# Patient Record
Sex: Male | Born: 1995
Health system: Southern US, Community
[De-identification: ages and names within clinical notes are randomized; demographics above are authoritative.]

## PROBLEM LIST (undated history)

## (undated) HISTORY — PX: LIVER SURGERY: SHX698

## (undated) HISTORY — PX: KIDNEY SURGERY: SHX687

---
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
## 2017-09-09 ENCOUNTER — Emergency Department (HOSPITAL_COMMUNITY)
Admission: EM | Admit: 2017-09-09 | Discharge: 2017-09-10 | Disposition: A | Discharge status: Home / Self Care | Payer: Worker's Compensation | Attending: Emergency Medicine | Admitting: Emergency Medicine

## 2017-09-09 ENCOUNTER — Encounter (HOSPITAL_COMMUNITY): Payer: Self-pay | Admitting: Emergency Medicine

## 2017-09-09 DIAGNOSIS — X58XXXA Exposure to other specified factors, initial encounter: Secondary | ICD-10-CM | POA: Diagnosis not present

## 2017-09-09 DIAGNOSIS — T1592XA Foreign body on external eye, part unspecified, left eye, initial encounter: Secondary | ICD-10-CM

## 2017-09-09 DIAGNOSIS — Y9389 Activity, other specified: Secondary | ICD-10-CM | POA: Insufficient documentation

## 2017-09-09 DIAGNOSIS — Z87891 Personal history of nicotine dependence: Secondary | ICD-10-CM | POA: Diagnosis not present

## 2017-09-09 DIAGNOSIS — Y990 Civilian activity done for income or pay: Secondary | ICD-10-CM | POA: Insufficient documentation

## 2017-09-09 DIAGNOSIS — Y929 Unspecified place or not applicable: Secondary | ICD-10-CM | POA: Insufficient documentation

## 2017-09-09 DIAGNOSIS — S0502XA Injury of conjunctiva and corneal abrasion without foreign body, left eye, initial encounter: Secondary | ICD-10-CM | POA: Insufficient documentation

## 2017-09-09 NOTE — ED Triage Notes (Signed)
Patient reported a small piece of tile chip hit his left eye while at work this evening , no bleeding or vision loss .

## 2017-09-10 MED ORDER — TETANUS-DIPHTH-ACELL PERTUSSIS 5-2.5-18.5 LF-MCG/0.5 IM SUSP: 0.5000 mL | Freq: Once | INTRAMUSCULAR | Status: DC

## 2017-09-10 MED ORDER — FLUORESCEIN SODIUM 1 MG OP STRP
1.0000 | ORAL_STRIP | Freq: Once | OPHTHALMIC | Status: AC
  Administered 2017-09-10: 1 via OPHTHALMIC
  Filled 2017-09-10: qty 1

## 2017-09-10 MED ORDER — KETOROLAC TROMETHAMINE 0.5 % OP SOLN
1.0000 [drp] | Freq: Four times a day (QID) | OPHTHALMIC | Status: DC
  Administered 2017-09-10: 1 [drp] via OPHTHALMIC
  Filled 2017-09-10: qty 3

## 2017-09-10 MED ORDER — TETRACAINE HCL 0.5 % OP SOLN
1.0000 [drp] | Freq: Once | OPHTHALMIC | Status: AC
  Administered 2017-09-10: 1 [drp] via OPHTHALMIC
  Filled 2017-09-10: qty 4

## 2017-09-10 MED ORDER — POLYMYXIN B-TRIMETHOPRIM 10000-0.1 UNIT/ML-% OP SOLN
1.0000 [drp] | OPHTHALMIC | Status: DC
  Administered 2017-09-10: 1 [drp] via OPHTHALMIC
  Filled 2017-09-10: qty 10

## 2017-09-10 NOTE — ED Provider Notes (Signed)
[facility]   CSN: [id number] Arrival date & time: 09/09/17  2145     History   Chief Complaint Chief Complaint  Patient presents with  . Eye Injury    HPI Bradley Tyler is a 22 y.o. male.  The history is provided by the patient and medical records.  Eye Injury      22 y.o. M here with left eye FB.  States he was tiling at work and a piece flew off and is lodged in his eye.  States he can tell that something is his eye but denies blurred vision.  Does not wear glasses or contact lenses.  No baseline vision issues.  Date of last tetanus unknown.  History reviewed. No pertinent past medical history.  There are no active problems to display for this patient.   Past Surgical History:  Procedure Laterality Date  . KIDNEY SURGERY    . LIVER SURGERY         Home Medications    Prior to Admission medications   Medication Sig Start Date End Date Taking? Authorizing Provider  cyclobenzaprine (FLEXERIL) 10 MG tablet Take 1 tablet (10 mg total) by mouth 2 (two) times daily as needed for muscle spasms. 06/10/14   Elpidio AnisUpstill, Shari, PA-C  HYDROcodone-acetaminophen (NORCO/VICODIN) 5-325 MG per tablet Take 1-2 tablets by mouth every 4 (four) hours as needed. 06/10/14   Elpidio AnisUpstill, Shari, PA-C  ibuprofen (ADVIL,MOTRIN) 800 MG tablet Take 1 tablet (800 mg total) by mouth 3 (three) times daily. 06/10/14   Elpidio AnisUpstill, Shari, PA-C  naproxen (NAPROSYN) 500 MG tablet Take 1 tablet (500 mg total) by mouth 2 (two) times daily with a meal. 11/21/13   Eber HongMiller, Brian, MD  ondansetron (ZOFRAN ODT) 4 MG disintegrating tablet Take 1 tablet (4 mg total) by mouth every 8 (eight) hours as needed for nausea. 11/21/13   Eber HongMiller, Brian, MD    Family History No family history on file.  Social History Social History   Tobacco Use  . Smoking status: Former Games developermoker  . Smokeless tobacco: Never Used  Substance Use Topics  . Alcohol use: No  . Drug use: No      Allergies   Patient has no known allergies.   Review of Systems Review of Systems  Eyes: Positive for pain.  All other systems reviewed and are negative.    Physical Exam Updated Vital Signs BP 139/88 (BP Location: Right Arm)   Pulse 72   Temp 98.6 F (37 C) (Oral)   Resp 18   SpO2 100%   Physical Exam  Constitutional: He is oriented to person, place, and time. He appears well-developed and well-nourished.  HENT:  Head: Normocephalic and atraumatic.  Mouth/Throat: Oropharynx is clear and moist.  Eyes: Conjunctivae and EOM are normal. Pupils are equal, round, and reactive to light.  FB noted to left central eye; conjunctiva appears injected; EOMs full intact; no lid edema or erythema  Neck: Normal range of motion.  Cardiovascular: Normal rate, regular rhythm and normal heart sounds.  Pulmonary/Chest: Effort normal and breath sounds normal. No stridor. No respiratory distress.  Musculoskeletal: Normal range of motion.  Neurological: He is alert and oriented to person, place, and time.  Skin: Skin is warm and dry.  Psychiatric: He has a normal mood and affect.  Nursing note and vitals reviewed.    ED Treatments / Results  Labs (all labs ordered are listed, but only abnormal results are displayed) Labs Reviewed - No data  to display  EKG  EKG Interpretation None       Radiology No results found.  Procedures .Foreign Body Removal Date/Time: 09/10/2017 1:27 AM Performed by: Garlon Hatchet, PA-C Authorized by: Garlon Hatchet, PA-C  Consent: Verbal consent obtained. Risks and benefits: risks, benefits and alternatives were discussed Consent given by: patient Required items: required blood products, implants, devices, and special equipment available Patient identity confirmed: verbally with patient Body area: eye Location details: left cornea  Anesthesia: Local Anesthetic: topical anesthetic  Sedation: Patient sedated: no  Patient restrained:  no Localization method: visualized Removal mechanism: 27-gauge needle, irrigation and moist cotton swab Eye examined with fluorescein. Corneal abrasion size: small Corneal abrasion location: central No residual rust ring present. Dressing: antibiotic drops Depth: embedded Complexity: simple 1 objects recovered. Objects recovered: small piece of tile Post-procedure assessment: foreign body removed Patient tolerance: Patient tolerated the procedure well with no immediate complications   (including critical care time)  Medications Ordered in ED Medications  ketorolac (ACULAR) 0.5 % ophthalmic solution 1 drop (1 drop Left Eye Given 09/10/17 0211)  trimethoprim-polymyxin b (POLYTRIM) ophthalmic solution 1 drop (1 drop Left Eye Given 09/10/17 0211)  Tdap (BOOSTRIX) injection 0.5 mL (0.5 mLs Intramuscular Refused 09/10/17 0211)  fluorescein ophthalmic strip 1 strip (1 strip Left Eye Given 09/10/17 0119)  tetracaine (PONTOCAINE) 0.5 % ophthalmic solution 1 drop (1 drop Left Eye Given 09/10/17 0119)     Initial Impression / Assessment and Plan / ED Course  I have reviewed the triage vital signs and the nursing notes.  Pertinent labs & imaging results that were available during my care of the patient were reviewed by me and considered in my medical decision making (see chart for details).  22 year old male here with piece of tile embedded in left eye.  States it is visible in the central portion of his vision.  This is visible on exam.  Topical tetracaine drops applied and the child was able to be removed with combination of sterile cotton swab, irrigation, and 23-gauge needle.  I was irrigated afterwards.  Fluorescein stain performed, does have evidence of corneal abrasion but no ulcer or uptake.  There is no residual rust ring.  Tetanus will be updated.  He will be started on Acular and Polytrim drops.  He will need close follow-up with ophthalmology-- referral given.  Discussed plan with patient,  he acknowledged understanding and agreed with plan of care.  Return precautions given for new or worsening symptoms.  Final Clinical Impressions(s) / ED Diagnoses   Final diagnoses:  Foreign body of left eye, initial encounter  Abrasion of left cornea, initial encounter    ED Discharge Orders    None       Garlon Hatchet, PA-C 09/10/17 0215    Shon Baton, MD 09/10/17 [phone]

## 2017-09-10 NOTE — Discharge Instructions (Signed)
Take the prescribed medication as directed. Follow-up with Dr. Allena KatzPatel (eye doctor) if you have any ongoing issues or any new/acute changes. Return here for new concerns.

## 2019-07-07 ENCOUNTER — Other Ambulatory Visit: Payer: Self-pay

## 2019-07-07 DIAGNOSIS — Z20822 Contact with and (suspected) exposure to covid-19: Secondary | ICD-10-CM

## 2019-07-08 LAB — NOVEL CORONAVIRUS, NAA: SARS-CoV-2, NAA: NOT DETECTED

## 2019-07-15 ENCOUNTER — Other Ambulatory Visit: Payer: Self-pay

## 2019-07-15 DIAGNOSIS — Z20822 Contact with and (suspected) exposure to covid-19: Secondary | ICD-10-CM

## 2019-07-16 LAB — NOVEL CORONAVIRUS, NAA: SARS-CoV-2, NAA: NOT DETECTED

## 2020-01-10 ENCOUNTER — Emergency Department (HOSPITAL_COMMUNITY)
Admission: EM | Admit: 2020-01-10 | Discharge: 2020-01-10 | Disposition: A | Discharge status: Home / Self Care | Payer: PRIVATE HEALTH INSURANCE | Attending: Emergency Medicine | Admitting: Emergency Medicine

## 2020-01-10 ENCOUNTER — Encounter (HOSPITAL_COMMUNITY): Payer: Self-pay

## 2020-01-10 DIAGNOSIS — W540XXA Bitten by dog, initial encounter: Secondary | ICD-10-CM | POA: Insufficient documentation

## 2020-01-10 DIAGNOSIS — Y929 Unspecified place or not applicable: Secondary | ICD-10-CM | POA: Insufficient documentation

## 2020-01-10 DIAGNOSIS — S61452A Open bite of left hand, initial encounter: Secondary | ICD-10-CM | POA: Insufficient documentation

## 2020-01-10 DIAGNOSIS — Y9389 Activity, other specified: Secondary | ICD-10-CM | POA: Insufficient documentation

## 2020-01-10 DIAGNOSIS — Y999 Unspecified external cause status: Secondary | ICD-10-CM | POA: Insufficient documentation

## 2020-01-10 DIAGNOSIS — Z5321 Procedure and treatment not carried out due to patient leaving prior to being seen by health care provider: Secondary | ICD-10-CM | POA: Insufficient documentation
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
## 2020-01-10 NOTE — ED Notes (Signed)
No call for VS x2 

## 2020-01-10 NOTE — ED Notes (Signed)
No answer for VS x 2 

## 2020-04-12 ENCOUNTER — Ambulatory Visit: Payer: Self-pay

## 2020-05-04 ENCOUNTER — Ambulatory Visit: Payer: Self-pay | Attending: Internal Medicine

## 2020-05-04 DIAGNOSIS — Z23 Encounter for immunization: Secondary | ICD-10-CM

## 2020-05-04 NOTE — Progress Notes (Signed)
   Covid-19 Vaccination Clinic  Name:  Bradley Tyler    MRN: [id number] DOB: 13-Oct-1995  05/04/2020  Bradley Tyler was observed post Covid-19 immunization for 15 minutes without incident. He was provided with Vaccine Information Sheet and instruction to access the V-Safe system.   Bradley Tyler was instructed to call 911 with any severe reactions post vaccine: Marland Kitchen Difficulty breathing  . Swelling of face and throat  . A fast heartbeat  . A bad rash all over body  . Dizziness and weakness   Immunizations Administered    Name Date Dose VIS Date Route   Pfizer COVID-19 Vaccine 05/04/2020 12:10 AM 0.3 mL 10/14/2018 Intramuscular   Manufacturer: [employer], [employer]   Lot: [id number]   NDC: [address]-4

## 2021-02-28 ENCOUNTER — Ambulatory Visit: Payer: Self-pay | Admitting: Internal Medicine

## 2021-03-29 ENCOUNTER — Ambulatory Visit: Payer: Self-pay | Admitting: Internal Medicine

## 2021-05-15 ENCOUNTER — Ambulatory Visit: Payer: Self-pay | Admitting: Internal Medicine

## 2021-08-11 ENCOUNTER — Encounter: Payer: Self-pay | Admitting: Emergency Medicine

## 2021-08-11 ENCOUNTER — Emergency Department
Admission: EM | Admit: 2021-08-11 | Discharge: 2021-08-11 | Disposition: A | Discharge status: Home / Self Care | Payer: BC Managed Care – PPO | Attending: Emergency Medicine | Admitting: Emergency Medicine

## 2021-08-11 ENCOUNTER — Other Ambulatory Visit: Payer: Self-pay

## 2021-08-11 DIAGNOSIS — S39012A Strain of muscle, fascia and tendon of lower back, initial encounter: Secondary | ICD-10-CM | POA: Insufficient documentation

## 2021-08-11 DIAGNOSIS — B349 Viral infection, unspecified: Secondary | ICD-10-CM | POA: Diagnosis not present

## 2021-08-11 DIAGNOSIS — Z20822 Contact with and (suspected) exposure to covid-19: Secondary | ICD-10-CM | POA: Insufficient documentation

## 2021-08-11 DIAGNOSIS — Y9389 Activity, other specified: Secondary | ICD-10-CM | POA: Diagnosis not present

## 2021-08-11 DIAGNOSIS — Z87891 Personal history of nicotine dependence: Secondary | ICD-10-CM | POA: Insufficient documentation

## 2021-08-11 DIAGNOSIS — S3992XA Unspecified injury of lower back, initial encounter: Secondary | ICD-10-CM | POA: Diagnosis present

## 2021-08-11 DIAGNOSIS — X500XXA Overexertion from strenuous movement or load, initial encounter: Secondary | ICD-10-CM | POA: Diagnosis not present

## 2021-08-11 LAB — URINALYSIS, COMPLETE (UACMP) WITH MICROSCOPIC
Bacteria, UA: NONE SEEN
Bilirubin Urine: NEGATIVE
Glucose, UA: NEGATIVE mg/dL
Hgb urine dipstick: NEGATIVE
Ketones, ur: NEGATIVE mg/dL
Leukocytes,Ua: NEGATIVE
Nitrite: NEGATIVE
Protein, ur: NEGATIVE mg/dL
Specific Gravity, Urine: 1.025 (ref 1.005–1.030)
Squamous Epithelial / LPF: NONE SEEN (ref 0–5)
pH: 6 (ref 5.0–8.0)

## 2021-08-11 LAB — RESP PANEL BY RT-PCR (FLU A&B, COVID) ARPGX2
Influenza A by PCR: NEGATIVE
Influenza B by PCR: NEGATIVE
SARS Coronavirus 2 by RT PCR: NEGATIVE

## 2021-08-11 MED ORDER — IBUPROFEN 600 MG PO TABS: 600.0000 mg | ORAL_TABLET | Freq: Four times a day (QID) | ORAL | 0 refills | Status: AC | PRN

## 2021-08-11 MED ORDER — KETOROLAC TROMETHAMINE 30 MG/ML IJ SOLN
30.0000 mg | Freq: Once | INTRAMUSCULAR | Status: AC
  Administered 2021-08-11: 10:00:00 30 mg via INTRAMUSCULAR
  Filled 2021-08-11: qty 1

## 2021-08-11 NOTE — ED Provider Notes (Signed)
[facility] Emergency Department Provider Note  ____________________________________________   Event Date/Time   First MD Initiated Contact with Patient 08/11/21 [phone]     (approximate)  I have reviewed the triage vital signs and the nursing notes.   HISTORY  Chief Complaint Back Pain, Generalized Body Aches, and Chills   HPI Bradley Tyler is a 25 y.o. male presents to the ED with complaint of low back pain for a couple of weeks.  Patient states that he lifted something heavy and also has been doing a lot of manual things which is aggravated his back.  He denies any previous problems with his back.  He states that the pain is more noticeable when he is lying flat.  He denies any urinary symptoms.  He also is here for flulike symptoms with chills and body aches, which is unrelated to his back pain.  He denies any incontinence of bowel or bladder or saddle anesthesias.  He denies any nausea, vomiting or diarrhea.  Patient reports that he has not taken any over-the-counter medication for his back prior to today's visit.  He rates pain as an 8 out of 10.         History reviewed. No pertinent past medical history.  There are no problems to display for this patient.   Past Surgical History:  Procedure Laterality Date   KIDNEY SURGERY     LIVER SURGERY      Prior to Admission medications   Medication Sig Start Date End Date Taking? Authorizing Provider  ibuprofen (ADVIL) 600 MG tablet Take 1 tablet (600 mg total) by mouth every 6 (six) hours as needed. 08/11/21  Yes Tommi Rumps, PA-C    Allergies Patient has no known allergies.  No family history on file.  Social History Social History   Tobacco Use   Smoking status: Former   Smokeless tobacco: Never  Substance Use Topics   Alcohol use: No   Drug use: No    Review of Systems Constitutional: Subjective fever/positive chills Eyes: No visual changes. ENT: No sore  throat. Cardiovascular: Denies chest pain. Respiratory: Denies shortness of breath.  Negative for cough. Gastrointestinal: No abdominal pain.  No nausea, no vomiting.  No diarrhea.   Genitourinary: Negative for dysuria. Musculoskeletal: Positive for low back pain.  Positive for body aches. Skin: Negative for rash. Neurological: Negative for headaches, focal weakness or numbness.  ____________________________________________   PHYSICAL EXAM:  VITAL SIGNS: ED Triage Vitals  Enc Vitals Group     BP 08/11/21 0802 (!) 129/98     Pulse Rate 08/11/21 0802 100     Resp 08/11/21 0802 20     Temp 08/11/21 0802 98.6 F (37 C)     Temp Source 08/11/21 0802 Oral     SpO2 08/11/21 0802 98 %     Weight 08/11/21 0755 250 lb (113.4 kg)     Height 08/11/21 0755 5\' 10"  (1.778 m)     Head Circumference --      Peak Flow --      Pain Score 08/11/21 0755 8     Pain Loc --      Pain Edu? --      Excl. in GC? --    Constitutional: Alert and oriented. Well appearing and in no acute distress. Eyes: Conjunctivae are normal.  Head: Atraumatic. Nose: No congestion/rhinnorhea. Neck: No stridor.   Cardiovascular: Normal rate, regular rhythm. Grossly normal heart sounds.  Good peripheral circulation. Respiratory: Normal respiratory effort.  No retractions. Lungs CTAB. Gastrointestinal: Soft and nontender. No distention.  No CVA tenderness. Musculoskeletal: Moderate tenderness on palpation of the L5-S1 and left paravertebral muscles.  No tenderness is noted on palpation of the thoracic spine or upper lumbar.  No gross deformity.  Range of motion lower extremities is without assistance and patient is ambulatory.  Good muscle strength bilaterally at 5/5. Neurologic:  Normal speech and language. No gross focal neurologic deficits are appreciated. No gait instability. Skin:  Skin is warm, dry and intact. No rash noted. Psychiatric: Mood and affect are normal. Speech and behavior are  normal.  ____________________________________________   LABS (all labs ordered are listed, but only abnormal results are displayed)  Labs Reviewed  URINALYSIS, COMPLETE (UACMP) WITH MICROSCOPIC - Abnormal; Notable for the following components:      Result Value   APPearance CLEAR (*)    All other components within normal limits  RESP PANEL BY RT-PCR (FLU A&B, COVID) [id number]    PROCEDURES  Procedure(s) performed (including Critical Care):  Procedures   ____________________________________________   INITIAL IMPRESSION / ASSESSMENT AND PLAN / ED COURSE  As part of my medical decision making, I reviewed the following data within the electronic MEDICAL RECORD NUMBER Notes from prior ED visits and [place] Controlled Substance Database   25 year old male presents to the ED with complaint of body aches and chills however he is also experienced low back pain for several weeks due to lifting heavy items at work.  Patient also has symptoms consistent with a virus.  Urinalysis was negative and patient was made aware that his influenza and COVID test were negative.  Patient states he has not taken any medication for his back at this time.  Patient was given an injection of Toradol 30 mg IM.  A prescription for ibuprofen 600 mg every 6 hours with food was sent to his pharmacy.  He is encouraged to use ice or heat to his back as needed for discomfort.  He is to follow-up with his PCP if any continued problems.  Also increase fluids for his viral symptoms.  ____________________________________________   FINAL CLINICAL IMPRESSION(S) / ED DIAGNOSES  Final diagnoses:  Lumbar strain, initial encounter  Viral illness     ED Discharge Orders          Ordered    ibuprofen (ADVIL) 600 MG tablet  Every 6 hours PRN        08/11/21 1029             Note:  This document was prepared using Dragon voice recognition software and may include unintentional dictation errors.    Tommi Rumps,  PA-C 08/11/21 1355    Chesley Noon, MD 08/12/21 [phone]

## 2021-08-11 NOTE — Discharge Instructions (Signed)
Follow-up with your primary care provider or urgent care if any continued problems with your low back.  A prescription for an anti-inflammatory pain medication was sent to your pharmacy.  This to be taken as directed with food.  You may also use ice or heat to your back as needed for discomfort.  Increase fluids for the virus.  You are negative for COVID and influenza.  You may also take Tylenol with the medication that was prescribed if additional medicine is needed for fever or body aches.

## 2021-08-11 NOTE — ED Triage Notes (Signed)
Pt reports last night started with chills and bodyaches. Pt reports unsure of fever but he felt hot. Pt also c/o back pain to lower back, denies injuries or urinary sx's.

## 2021-08-11 NOTE — ED Notes (Signed)
Pt to ED for generalized body aches and chills since last night. Pt is afebrile. Denies cough, SOB, CP. Ambulatory to room, NAD.  Endorses chronic back pain as well.

## 2021-08-18 ENCOUNTER — Emergency Department
Admission: EM | Admit: 2021-08-18 | Discharge: 2021-08-18 | Disposition: A | Discharge status: Home / Self Care | Payer: BC Managed Care – PPO | Attending: Emergency Medicine | Admitting: Emergency Medicine

## 2021-08-18 ENCOUNTER — Encounter: Payer: Self-pay | Admitting: Emergency Medicine

## 2021-08-18 ENCOUNTER — Other Ambulatory Visit: Payer: Self-pay

## 2021-08-18 DIAGNOSIS — Z79899 Other long term (current) drug therapy: Secondary | ICD-10-CM | POA: Insufficient documentation

## 2021-08-18 DIAGNOSIS — F17210 Nicotine dependence, cigarettes, uncomplicated: Secondary | ICD-10-CM | POA: Insufficient documentation

## 2021-08-18 DIAGNOSIS — M791 Myalgia, unspecified site: Secondary | ICD-10-CM | POA: Insufficient documentation

## 2021-08-18 DIAGNOSIS — Z20822 Contact with and (suspected) exposure to covid-19: Secondary | ICD-10-CM | POA: Insufficient documentation

## 2021-08-18 DIAGNOSIS — M5432 Sciatica, left side: Secondary | ICD-10-CM | POA: Insufficient documentation

## 2021-08-18 DIAGNOSIS — R110 Nausea: Secondary | ICD-10-CM | POA: Diagnosis not present

## 2021-08-18 LAB — RESP PANEL BY RT-PCR (FLU A&B, COVID) ARPGX2
Influenza A by PCR: NEGATIVE
Influenza B by PCR: NEGATIVE
SARS Coronavirus 2 by RT PCR: POSITIVE — AB

## 2021-08-18 MED ORDER — ACETAMINOPHEN 500 MG PO TABS
1000.0000 mg | ORAL_TABLET | Freq: Once | ORAL | Status: AC
  Administered 2021-08-18: 11:00:00 1000 mg via ORAL
  Filled 2021-08-18: qty 2

## 2021-08-18 MED ORDER — IBUPROFEN 400 MG PO TABS
400.0000 mg | ORAL_TABLET | Freq: Once | ORAL | Status: AC
  Administered 2021-08-18: 11:00:00 400 mg via ORAL
  Filled 2021-08-18: qty 1

## 2021-08-18 NOTE — ED Triage Notes (Signed)
Lower back pain x3 weeks. States he was seen recently for body aches and fever. States now he is starting to have body aches again, but denies fevers. Patient also c/o "numbness" to left foot. Patient has +sensation, movement, and pulse. States back pain is mostly right lower back pain.

## 2021-08-18 NOTE — Discharge Instructions (Addendum)
I suspect that your left foot numbness is from compression of your sciatic nerve.  Please follow-up with both your primary care provider.  You can also follow-up with neurosurgery if your symptoms continue as you may need to have an MRI at some point.  If you develop worsening weakness, inability to ambulate or difficulty urinating, please return to the emergency department.  I suspect that your body aches are from a virus.  You can take Tylenol and Motrin for your symptoms.  We have sent testing for COVID and influenza.

## 2021-08-18 NOTE — ED Notes (Signed)
See triage note   presents with lower back pain  which is moving into left leg  also having some body aches  ambulates well to  treatment room

## 2021-08-18 NOTE — ED Provider Notes (Signed)
[facility]  ____________________________________________   Event Date/Time   First MD Initiated Contact with Patient 08/18/21 1027     (approximate)  I have reviewed the triage vital signs and the nursing notes.   HISTORY  Chief Complaint No chief complaint on file.    HPI Bradley Tyler is a 25 y.o. male with no significant past medical history presents with left foot numbness and myalgias.  Patient notes that over the last 3 weeks he has had left-sided back pain.  Pain is now radiating down to the left foot.  He has intermittent numbness in the left foot but no weakness.  He denies bowel or bladder incontinence or saddle anesthesia.  No fevers chills.  Denies IV drug use.  He denies any trauma.  Patient was seen for this before.  He also has myalgias over the last day.  Has diffuse body aches.  Denies fever.  Denies cough congestion, does have mild headache.  Some nausea but no vomiting.  He does have an exposure to COVID.         History reviewed. No pertinent past medical history.  There are no problems to display for this patient.   Past Surgical History:  Procedure Laterality Date   KIDNEY SURGERY     LIVER SURGERY      Prior to Admission medications   Medication Sig Start Date End Date Taking? Authorizing Provider  ibuprofen (ADVIL) 600 MG tablet Take 1 tablet (600 mg total) by mouth every 6 (six) hours as needed. 08/11/21   Tommi Rumps, PA-C    Allergies Patient has no known allergies.  No family history on file.  Social History Social History   Tobacco Use   Smoking status: Every Day    Types: Cigarettes   Smokeless tobacco: Never  Substance Use Topics   Alcohol use: Yes   Drug use: No    Review of Systems   Review of Systems  Constitutional:  Negative for chills and fever.  HENT:  Negative for congestion.   Respiratory:  Negative for cough and shortness of breath.   Gastrointestinal:  Positive for nausea. Negative  for abdominal pain and vomiting.  Musculoskeletal:  Positive for back pain and gait problem.  Neurological:  Positive for numbness. Negative for weakness.  All other systems reviewed and are negative.  Physical Exam Updated Vital Signs BP (!) 147/97 (BP Location: Left Arm)    Pulse (!) 108    Temp 99.8 F (37.7 C) (Oral)    Resp 19    Ht 5\' 10"  (1.778 m)    Wt 113.4 kg    SpO2 97%    BMI 35.87 kg/m   Physical Exam Vitals and nursing note reviewed.  Constitutional:      General: He is not in acute distress.    Appearance: Normal appearance.  HENT:     Head: Normocephalic and atraumatic.  Eyes:     General: No scleral icterus.    Conjunctiva/sclera: Conjunctivae normal.  Pulmonary:     Effort: Pulmonary effort is normal. No respiratory distress.     Breath sounds: Normal breath sounds. No wheezing.  Musculoskeletal:        General: No deformity or signs of injury.     Cervical back: Normal range of motion.     Comments: No midline T or L-spine tenderness 5-5 strength with hip flexion, plantar flexion and dorsiflexion 4-5 strength with great toe extension on the left compared to 5 out of  5 on the right Subjective decreased sensation over the dorsum of the left foot  Skin:    Coloration: Skin is not jaundiced or pale.  Neurological:     Mental Status: He is alert and oriented to person, place, and time. Mental status is at baseline.  Psychiatric:        Mood and Affect: Mood normal.        Behavior: Behavior normal.     LABS (all labs ordered are listed, but only abnormal results are displayed)  Labs Reviewed  RESP PANEL BY RT-PCR (FLU A&B, COVID) [id number]   ____________________________________________  EKG   ____________________________________________  RADIOLOGY I, Randol Kern, personally viewed and evaluated these images (plain radiographs) as part of my medical decision making, as well as reviewing the written report by the radiologist.  ED MD  interpretation:      ____________________________________________   PROCEDURES  Procedure(s) performed (including Critical Care):  Procedures   ____________________________________________   INITIAL IMPRESSION / ASSESSMENT AND PLAN / ED COURSE     25 year old male presents with both back pain and left foot numbness has been going on for 3 weeks and a day of myalgias.  In terms of his back pain his presentation is most consistent with sciatica.  He does have some occasional numbness in the left foot and on exam has decreased strength with great toe extension and some subjective decrease sensation over the dorsum of the foot.  He has no red flag symptoms cauda equina syndrome including bowel or bladder incontinence, inability to ambulate, fevers, IV drug use etc.  Treat supportively with NSAIDs and refer to orthopedics given this is been going on for 3 weeks and he is now having these radicular symptoms.  In terms of his myalgias this is been going on for the last day he does have a COVID exposure.  Suspect viral syndrome.  We will send COVID and flu testing.  Again we will treat supportively with Tylenol and NSAIDs.      ____________________________________________   FINAL CLINICAL IMPRESSION(S) / ED DIAGNOSES  Final diagnoses:  Sciatica of left side  Myalgia     ED Discharge Orders     None        Note:  This document was prepared using Dragon voice recognition software and may include unintentional dictation errors.    Georga Hacking, MD 08/18/21 [phone]

## 2021-12-04 ENCOUNTER — Ambulatory Visit
Admission: EM | Admit: 2021-12-04 | Discharge: 2021-12-04 | Disposition: A | Discharge status: Home / Self Care | Payer: BC Managed Care – PPO | Attending: Family Medicine | Admitting: Family Medicine

## 2021-12-04 DIAGNOSIS — J069 Acute upper respiratory infection, unspecified: Secondary | ICD-10-CM

## 2021-12-04 MED ORDER — PROMETHAZINE-DM 6.25-15 MG/5ML PO SYRP: 5.0000 mL | ORAL_SOLUTION | Freq: Four times a day (QID) | ORAL | 0 refills | Status: AC | PRN

## 2021-12-04 NOTE — ED Triage Notes (Signed)
Pt states for the past 3 days he started having cold symptoms ? ?Pt states he has coughed nonstop with congestion and body aches ? ?Pt states that his chest hurts for home ? ?Pt states he tried a bunch of OTC without relief ? ?Denies Fever ? ? ?

## 2021-12-04 NOTE — ED Provider Notes (Signed)
?[facility] ? ? ? ?CSN: [id number] ?Arrival date & time: 12/04/21  1059 ? ? ?  ? ?History   ?Chief Complaint ?Chief Complaint  ?Patient presents with  ? Cough  ?  Congestion and cough  ? ? ?HPI ?Bradley Tyler is a 26 y.o. male.  ? ?Presenting today with 3-day history of cough, congestion, body aches, chills, fatigue.  Denies chest pain, shortness of breath, abdominal pain, nausea vomiting or diarrhea.  Trying over-the-counter cold and congestion medication with no relief.  No known sick contacts recently.  No known pertinent chronic medical problems. ? ? ?History reviewed. No pertinent past medical history. ? ?There are no problems to display for this patient. ? ? ?Past Surgical History:  ?Procedure Laterality Date  ? KIDNEY SURGERY    ? LIVER SURGERY    ? ? ? ? ? ?Home Medications   ? ?Prior to Admission medications   ?Medication Sig Start Date End Date Taking? Authorizing Provider  ?promethazine-dextromethorphan (PROMETHAZINE-DM) 6.25-15 MG/5ML syrup Take 5 mLs by mouth 4 (four) times daily as needed. 12/04/21  Yes Volney American, PA-C  ?ibuprofen (ADVIL) 600 MG tablet Take 1 tablet (600 mg total) by mouth every 6 (six) hours as needed. 08/11/21   Johnn Hai, PA-C  ? ? ?Family History ?No family history on file. ? ?Social History ?Social History  ? ?Tobacco Use  ? Smoking status: Every Day  ?  Types: Cigars  ? Smokeless tobacco: Never  ?Vaping Use  ? Vaping Use: Never used  ?Substance Use Topics  ? Alcohol use: Yes  ?  Comment: Occas  ? Drug use: No  ? ? ? ?Allergies   ?Patient has no known allergies. ? ? ?Review of Systems ?Review of Systems ?Per HPI ? ?Physical Exam ?Triage Vital Signs ?ED Triage Vitals  ?Enc Vitals Group  ?   BP 12/04/21 1256 128/86  ?   Pulse Rate 12/04/21 1256 70  ?   Resp 12/04/21 1256 18  ?   Temp 12/04/21 1256 98 ?F (36.7 ?C)  ?   Temp Source 12/04/21 1256 Oral  ?   SpO2 12/04/21 1256 96 %  ?   Weight --   ?   Height --   ?   Head Circumference --   ?   Peak Flow  --   ?   Pain Score 12/04/21 1255 10  ?   Pain Loc --   ?   Pain Edu? --   ?   Excl. in [place]? --   ? ?No data found. ? ?Updated Vital Signs ?BP 128/86 (BP Location: Right Arm)   Pulse 70   Temp 98 ?F (36.7 ?C) (Oral)   Resp 18   SpO2 96%  ? ?Visual Acuity ?Right Eye Distance:   ?Left Eye Distance:   ?Bilateral Distance:   ? ?Right Eye Near:   ?Left Eye Near:    ?Bilateral Near:    ? ?Physical Exam ?Vitals and nursing note reviewed.  ?Constitutional:   ?   Appearance: He is well-developed.  ?HENT:  ?   Head: Atraumatic.  ?   Right Ear: External ear normal.  ?   Left Ear: External ear normal.  ?   Nose: Rhinorrhea present.  ?   Mouth/Throat:  ?   Pharynx: Posterior oropharyngeal erythema present. No oropharyngeal exudate.  ?Eyes:  ?   Conjunctiva/sclera: Conjunctivae normal.  ?   Pupils: Pupils are equal, round, and reactive to light.  ?Cardiovascular:  ?  Rate and Rhythm: Normal rate and regular rhythm.  ?Pulmonary:  ?   Effort: Pulmonary effort is normal. No respiratory distress.  ?   Breath sounds: No wheezing or rales.  ?Musculoskeletal:     ?   General: Normal range of motion.  ?   Cervical back: Normal range of motion and neck supple.  ?Lymphadenopathy:  ?   Cervical: No cervical adenopathy.  ?Skin: ?   General: Skin is warm and dry.  ?Neurological:  ?   Mental Status: He is alert and oriented to person, place, and time.  ?Psychiatric:     ?   Behavior: Behavior normal.  ? ? ? ?UC Treatments / Results  ?Labs ?(all labs ordered are listed, but only abnormal results are displayed) ?Labs Reviewed  ?COVID-19, FLU A+B AND RSV  ? ? ?EKG ? ? ?Radiology ?No results found. ? ?Procedures ?Procedures (including critical care time) ? ?Medications Ordered in UC ?Medications - No data to display ? ?Initial Impression / Assessment and Plan / UC Course  ?I have reviewed the triage vital signs and the nursing notes. ? ?Pertinent labs & imaging results that were available during my care of the patient were reviewed by me and  considered in my medical decision making (see chart for details). ? ?  ? ?Vitals and exam very reassuring today, suggestive of a viral upper respiratory infection.  COVID and flu test pending, treat with Phenergan DM, supportive over-the-counter medications and home care.  Work note given.  Return for acutely worsening symptoms. ? ?Final Clinical Impressions(s) / UC Diagnoses  ? ?Final diagnoses:  ?Viral URI with cough  ? ?Discharge Instructions   ?None ?  ? ?ED Prescriptions   ? ? Medication Sig Dispense Auth. Provider  ? promethazine-dextromethorphan (PROMETHAZINE-DM) 6.25-15 MG/5ML syrup Take 5 mLs by mouth 4 (four) times daily as needed. 100 mL Volney American, [place]  ? ?  ? ?PDMP not reviewed this encounter. ?  ?Volney American, PA-C ?12/04/21 1348 ? ?

## 2021-12-06 LAB — COVID-19, FLU A+B AND RSV
Influenza A, NAA: NOT DETECTED
Influenza B, NAA: NOT DETECTED
RSV, NAA: NOT DETECTED
SARS-CoV-2, NAA: NOT DETECTED
